# Patient Record
Sex: Female | Born: 1955 | Race: White | Hispanic: No | Marital: Married | Smoking: Never smoker
Health system: Southern US, Community
[De-identification: ages and names within clinical notes are randomized; demographics above are authoritative.]

---

## 2015-05-09 ENCOUNTER — Ambulatory Visit (INDEPENDENT_AMBULATORY_CARE_PROVIDER_SITE_OTHER): Payer: BLUE CROSS/BLUE SHIELD

## 2015-05-09 ENCOUNTER — Ambulatory Visit (INDEPENDENT_AMBULATORY_CARE_PROVIDER_SITE_OTHER): Payer: BLUE CROSS/BLUE SHIELD | Admitting: Podiatry

## 2015-05-09 ENCOUNTER — Encounter: Payer: Self-pay | Admitting: Podiatry

## 2015-05-09 VITALS — BP 150/94 | HR 82 | Resp 18

## 2015-05-09 DIAGNOSIS — R52 Pain, unspecified: Secondary | ICD-10-CM

## 2015-05-09 DIAGNOSIS — M722 Plantar fascial fibromatosis: Secondary | ICD-10-CM

## 2015-05-09 MED ORDER — DICLOFENAC SODIUM 75 MG PO TBEC: 75.0000 mg | DELAYED_RELEASE_TABLET | Freq: Two times a day (BID) | ORAL | Status: AC

## 2015-05-09 NOTE — Patient Instructions (Signed)

## 2015-05-09 NOTE — Progress Notes (Signed)
   Subjective:    Patient ID: Alexis Palmer, female    DOB: 01-12-1956, 60 y.o.   MRN: [id number]  HPI  60 year old female presents the office concerns of left heel pain which started in January after she was going barefoot at home quite a bit during the snow. She states that she has pain after rest which is relieved by ambulation. There is no numbness or tingling. No swelling or redness. No recent injury or trauma. She said no previous treatment other than ice to the area. Ice does seem to help. No other complaints at this time.  Review of Systems  All other systems reviewed and are negative.      Objective:   Physical Exam General: AAO x3, NAD  Dermatological: Skin is warm, dry and supple bilateral. Nails x 10 are well manicured; remaining integument appears unremarkable at this time. There are no open sores, no preulcerative lesions, no rash or signs of infection present.  Vascular: Dorsalis Pedis artery and Posterior Tibial artery pedal pulses are 2/4 bilateral with immedate capillary fill time. Pedal hair growth present. No varicosities and no lower extremity edema present bilateral. There is no pain with calf compression, swelling, warmth, erythema.   Neruologic: Grossly intact via light touch bilateral. Vibratory intact via tuning fork bilateral. Protective threshold with Semmes Wienstein monofilament intact to all pedal sites bilateral. Patellar and Achilles deep tendon reflexes 2+ bilateral. No Babinski or clonus noted bilateral.   Musculoskeletal:  Tenderness to palpation along the plantar medial tubercle of the calcaneus at the insertion of plantar fascia on the left foot. There is no pain along the course of the plantar fascia within the arch of the foot. Plantar fascia appears to be intact. There is no pain with lateral compression of the calcaneus or pain with vibratory sensation. There is no pain along the course or insertion of the achilles tendon. No other areas of tenderness  to bilateral lower extremities. ROM WNL. Muscular strength 5/5 in all groups tested bilateral.  Gait: Unassisted, Nonantalgic.      Assessment & Plan:  60 year old female left heel pain, likely plantar fasciitis -Treatment options discussed including all alternatives, risks, and complications -X-rays were obtained and reviewed with the patient.  -Etiology of symptoms were discussed -Patient elects to proceed with steroid injection into the left heel. Under sterile skin preparation, a total of 2.5cc of kenalog 10, 0.5% Marcaine plain, and 2% lidocaine plain were infiltrated into the symptomatic area without complication. A band-aid was applied. Patient tolerated the injection well without complication. Post-injection care with discussed with the patient. Discussed with the patient to ice the area over the next couple of days to help prevent a steroid flare.  -Fascial brace -Stretching, icing exercises -Ice to the area -Shoe gear modifications. Discussed orthotics. -Prescribed voltren. Discussed side effects of the medication and directed to stop if any are to occur and call the office.  -Follow-up in 3 weeks or sooner if any problems arise. In the meantime, encouraged to call the office with any questions, concerns, change in symptoms.   Ovid Curd, DPM

## 2015-05-30 ENCOUNTER — Encounter: Payer: Self-pay | Admitting: Podiatry

## 2015-05-30 ENCOUNTER — Ambulatory Visit (INDEPENDENT_AMBULATORY_CARE_PROVIDER_SITE_OTHER): Payer: BLUE CROSS/BLUE SHIELD | Admitting: Podiatry

## 2015-05-30 VITALS — BP 135/83 | HR 82 | Resp 18

## 2015-05-30 DIAGNOSIS — M722 Plantar fascial fibromatosis: Secondary | ICD-10-CM | POA: Diagnosis not present

## 2015-05-30 NOTE — Progress Notes (Signed)
Patient ID: Alexis Palmer, female   DOB: 1956-01-17, 60 y.o.   MRN: [id number]  Subjective: [name] presents to the office today for follow-up evaluation of left heel pain. She feels taht she is doing better, but still having some pain They have been icing, stretching, try to wear supportive shoe as much as possible. She has been wearing the plantar fascial brace as well.   No other complaints at this time. No acute changes since last appointment. They deny any systemic complaints such as fevers, chills, nausea, vomiting.   Objective: General: AAO x3, NAD  Dermatological: Skin is warm, dry and supple bilateral. Nails x 10 are well manicured; remaining integument appears unremarkable at this time. There are no open sores, no preulcerative lesions, no rash or signs of infection present.  Vascular: Dorsalis Pedis artery and Posterior Tibial artery pedal pulses are 2/4 bilateral with immedate capillary fill time. Pedal hair growth present. There is no pain with calf compression, swelling, warmth, erythema.   Neruologic: Grossly intact via light touch bilateral.   Musculoskeletal: There is decreased, but  continued tenderness palpation along the plantar medial tubercle of the calcaneus at the insertion of the plantar fascia on the left foot. There is no pain along the course of the plantar fascia within the arch of the foot. Plantar fascia appears to be intact bilaterally. There is no pain with lateral compression of the calcaneus and there is no pain with vibratory sensation. There is no pain along the course or insertion of the Achilles tendon. There are no other areas of tenderness to bilateral lower extremities. No gross boney pedal deformities bilateral. No pain, crepitus, or limitation noted with foot and ankle range of motion bilateral. Muscular strength 5/5 in all groups tested bilateral.  Gait: Unassisted, Nonantalgic.   Assessment: Presents for follow-up evaluation for heel pain,  likely plantar fasciitis with some improvement.   Plan: -Treatment options discussed including all alternatives, risks, and complications -Patient elects to proceed with steroid injection into the left  heel. Under sterile skin preparation, a total of 2.5cc of kenalog 10, 0.5% Marcaine plain, and 2% lidocaine plain were infiltrated into the symptomatic area without complication. A band-aid was applied. Patient tolerated the injection well without complication. Post-injection care with discussed with the patient. Discussed with the patient to ice the area over the next couple of days to help prevent a steroid flare.  -Continue plantar fascial brace -Voltaren prn -Ice and stretching exercises on a daily basis. -Continue supportive shoe gear. -Follow-up in 3 weeks or sooner if any problems arise. In the meantime, encouraged to call the office with any questions, concerns, change in symptoms.   Ovid Curd, DPM   *SHE DID NOT HAVE AN INJECTION LAST APPOINTMENT. CHARGES TO BE REMOVED.

## 2015-06-11 ENCOUNTER — Ambulatory Visit
Admission: EM | Admit: 2015-06-11 | Discharge: 2015-06-11 | Disposition: A | Discharge status: Home / Self Care | Payer: BLUE CROSS/BLUE SHIELD | Attending: Family Medicine | Admitting: Family Medicine

## 2015-06-11 DIAGNOSIS — J069 Acute upper respiratory infection, unspecified: Secondary | ICD-10-CM

## 2015-06-11 DIAGNOSIS — J309 Allergic rhinitis, unspecified: Secondary | ICD-10-CM | POA: Diagnosis not present

## 2015-06-11 DIAGNOSIS — R059 Cough, unspecified: Secondary | ICD-10-CM

## 2015-06-11 DIAGNOSIS — R05 Cough: Secondary | ICD-10-CM

## 2015-06-11 MED ORDER — HYDROCOD POLST-CPM POLST ER 10-8 MG/5ML PO SUER: 5.0000 mL | Freq: Every evening | ORAL | Status: AC | PRN

## 2015-06-11 MED ORDER — FLUTICASONE PROPIONATE 50 MCG/ACT NA SUSP: 2.0000 | Freq: Every day | NASAL | Status: AC

## 2015-06-11 NOTE — Discharge Instructions (Signed)
Tylenol or advil as directed as needed Cough, Adult Coughing is a reflex that clears your throat and your airways. Coughing helps to heal and protect your lungs. It is normal to cough occasionally, but a cough that happens with other symptoms or lasts a long time may be a sign of a condition that needs treatment. A cough may last only 2-3 weeks (acute), or it may last longer than 8 weeks (chronic). CAUSES Coughing is commonly caused by:  Breathing in substances that irritate your lungs.  A viral or bacterial respiratory infection.  Allergies.  Asthma.  Postnasal drip.  Smoking.  Acid backing up from the stomach into the esophagus (gastroesophageal reflux).  Certain medicines.  Chronic lung problems, including COPD (or rarely, lung cancer).  Other medical conditions such as heart failure. HOME CARE INSTRUCTIONS  Pay attention to any changes in your symptoms. Take these actions to help with your discomfort:  Take medicines only as told by your health care provider.  If you were prescribed an antibiotic medicine, take it as told by your health care provider. Do not stop taking the antibiotic even if you start to feel better.  Talk with your health care provider before you take a cough suppressant medicine.  Drink enough fluid to keep your urine clear or pale yellow.  If the air is dry, use a cold steam vaporizer or humidifier in your bedroom or your home to help loosen secretions.  Avoid anything that causes you to cough at work or at home.  If your cough is worse at night, try sleeping in a semi-upright position.  Avoid cigarette smoke. If you smoke, quit smoking. If you need help quitting, ask your health care provider.  Avoid caffeine.  Avoid alcohol.  Rest as needed. SEEK MEDICAL CARE IF:   You have new symptoms.  You cough up pus.  Your cough does not get better after 2-3 weeks, or your cough gets worse.  You cannot control your cough with suppressant  medicines and you are losing sleep.  You develop pain that is getting worse or pain that is not controlled with pain medicines.  You have a fever.  You have unexplained weight loss.  You have night sweats. SEEK IMMEDIATE MEDICAL CARE IF:  You cough up blood.  You have difficulty breathing.  Your heartbeat is very fast.   This information is not intended to replace advice given to you by your health care provider. Make sure you discuss any questions you have with your health care provider.   Document Released: 09/14/2010 Document Revised: 12/07/2014 Document Reviewed: 05/25/2014 Elsevier Interactive Patient Education [employer]2016 [employer] Inc[employer]

## 2015-06-11 NOTE — ED Notes (Signed)
Patient c/o cough and chest congestion which started a few days ago.  Denies fever/c/n/c or chest pain.

## 2015-06-11 NOTE — ED Provider Notes (Signed)
CSN: [id number]     Arrival date & time 06/11/15  1145 History   First MD Initiated Contact with Patient 06/11/15 1400     Chief Complaint  Patient presents with  . Cough    HPI  Alexis Palmer is a pleasant 60 y.o. female who presents with acute cough and chest congestion and nasal congestion for last 4 days. She also has a headache. Her cough has been nonproductive. She denies any history of asthma but does have seasonal allergies. She denies any contacts. She has tried over-the-counter Mucinex for the past 4 days. She denies chest pain, shortness of breath, or wheezing. She does have postnasal drip. Her cough is worse at bedtime while she is trying to sleep. She denies any fever. History reviewed. No pertinent past medical history. History reviewed. No pertinent past surgical history. History reviewed. No pertinent family history. Social History  Substance Use Topics  . Smoking status: Never Smoker   . Smokeless tobacco: Never Used  . Alcohol Use: No   OB History    No data available     Review of Systems  Constitutional: Negative.   HENT: Positive for congestion, postnasal drip and rhinorrhea. Negative for sinus pressure.   Eyes: Negative.   Respiratory: Positive for cough and shortness of breath. Negative for chest tightness and wheezing.   Cardiovascular: Negative.   Gastrointestinal: Negative.   Endocrine: Negative.   Genitourinary: Negative.   Musculoskeletal: Negative.   Skin: Negative.   Neurological: Negative.   Hematological: Negative.   Psychiatric/Behavioral: Negative.     Allergies  Penicillin g and Sulfamethoxazole-trimethoprim  Home Medications   Prior to Admission medications   Medication Sig Start Date End Date Taking? Authorizing Provider  Multiple Vitamin (MULTIVITAMIN) capsule Take 1 capsule by mouth daily.   Yes Historical Provider, MD  Black Cohosh ([facility] BLACK COHOSH) 540 MG CAPS Take by mouth.    Historical Provider, MD   chlorpheniramine-HYDROcodone (TUSSIONEX PENNKINETIC ER) 10-8 MG/5ML SUER Take 5 mLs by mouth at bedtime as needed for cough. 06/11/15   Joselyn ArrowKandice L Alixander Rallis, NP  Cholecalciferol (VITAMIN D3) 1000 units CAPS Take by mouth.    Historical Provider, MD  Coenzyme Q10 Liposomal 100 MG/ML LIQD Take by mouth.    Historical Provider, MD  diclofenac (VOLTAREN) 75 MG EC tablet Take 1 tablet (75 mg total) by mouth 2 (two) times daily. 05/09/15   Vivi BarrackMatthew R Wagoner, DPM  fluticasone (FLONASE) 50 MCG/ACT nasal spray Place 2 sprays into both nostrils daily. 06/11/15   Joselyn ArrowKandice L Shailyn Weyandt, NP  simvastatin (ZOCOR) 20 MG tablet  03/09/15   Historical Provider, MD   Meds Ordered and Administered this Visit  Medications - No data to display  BP 139/71 mmHg  Pulse 67  Temp(Src) 98.2 F (36.8 C) (Oral)  Resp 16  Ht 5\' 4"  (1.626 m)  Wt 159 lb (72.122 kg)  BMI 27.28 kg/m2  SpO2 99% No data found.   Physical Exam  Constitutional: She is oriented to person, place, and time. She appears well-developed and well-nourished. No distress.  HENT:  Head: Normocephalic and atraumatic.  Right Ear: Hearing, tympanic membrane, external ear and ear canal normal.  Left Ear: Hearing, tympanic membrane, external ear and ear canal normal.  Nose: Mucosal edema and rhinorrhea present. Right sinus exhibits no maxillary sinus tenderness and no frontal sinus tenderness. Left sinus exhibits no maxillary sinus tenderness and no frontal sinus tenderness.  Mouth/Throat: Uvula is midline, oropharynx is clear and moist and mucous membranes are normal.  Eyes: Conjunctivae are normal. No scleral icterus.  Neck: Normal range of motion.  Cardiovascular: Normal rate and regular rhythm.   Pulmonary/Chest: Effort normal and breath sounds normal. No tachypnea. No respiratory distress.  Abdominal: Soft. Bowel sounds are normal. She exhibits no distension.  Musculoskeletal: Normal range of motion. She exhibits no edema or tenderness.  Neurological: She is  alert and oriented to person, place, and time. No cranial nerve deficit.  Skin: Skin is warm and dry. No rash noted. No erythema.  Psychiatric: Her behavior is normal. Judgment normal.  Nursing note and vitals reviewed.   ED Course  Procedures NA  Labs Review Labs Reviewed - No data to display  Imaging Review No results found.  MDM   1. Acute URI   2. Cough   3. Allergic rhinitis, unspecified allergic rhinitis type    Explained to patient this is likely viral URI with underlying allergic rhinitis. Cough and symptoms may persist for up to 3 weeks at times. Continue supportive measures. Reassured and discussed warning signs for patient to return to clinic including SOB, weakness, unrelenting fever, severe pain or worsening symptoms.  Plan: Diagnosis reviewed with patient Ibuprofen 400-600mg  TID PRN Or tylenol PRN as directed Continue Mucinex 1-2 tabs BID PRN Recommend supportive treatment with rest,  Increased fluids Seek additional medical care if symptoms worsen or are not improving    Joselyn Arrow, NP 06/11/15 1432

## 2015-06-20 ENCOUNTER — Ambulatory Visit (INDEPENDENT_AMBULATORY_CARE_PROVIDER_SITE_OTHER): Payer: BLUE CROSS/BLUE SHIELD | Admitting: Podiatry

## 2015-06-20 ENCOUNTER — Encounter: Payer: Self-pay | Admitting: Podiatry

## 2015-06-20 VITALS — BP 133/89 | HR 89 | Resp 18

## 2015-06-20 DIAGNOSIS — M722 Plantar fascial fibromatosis: Secondary | ICD-10-CM

## 2015-06-20 NOTE — Patient Instructions (Signed)
You can purchase powersteps or superfeet inserts at some of the sporting good stores. If you cannot find a pair that you like, you can stop in to our front desk and purchase a pair.

## 2015-06-21 NOTE — Progress Notes (Signed)
Patient ID: Alexis DevoidDoreen Savard, female   DOB: 11/12/1955, 60 y.o.   MRN: [id number]  Subjective: Alexis Palmer presents to the office today for follow-up evaluation of left heel pain. She states that she is doing well, and not having any pain at this time. She has continued with icing, stretching, plantar fascial brace, and supportive shoes. She is able to do activities without any pain. No morning pain. She is not taking NSAIDs.  No other complaints at this time. No acute changes since last appointment. They deny any systemic complaints such as fevers, chills, nausea, vomiting.  Objective: General: AAO x3, NAD  Dermatological: Skin is warm, dry and supple bilateral. Nails x 10 are well manicured; remaining integument appears unremarkable at this time. There are no open sores, no preulcerative lesions, no rash or signs of infection present.  Vascular: Dorsalis Pedis artery and Posterior Tibial artery pedal pulses are 2/4 bilateral with immedate capillary fill time. Pedal hair growth present. There is no pain with calf compression, swelling, warmth, erythema.   Neruologic: Grossly intact via light touch bilateral. Vibratory intact via tuning fork bilateral. Protective threshold with Semmes Wienstein monofilament intact to all pedal sites bilateral.   Musculoskeletal: There is currently no tenderness palpation along the plantar medial tubercle of the calcaneus at the insertion of the plantar fascia on the left foot. There is no pain along the course of the plantar fascia within the arch of the foot. Plantar fascia appears to be intact bilaterally. There is no pain with lateral compression of the calcaneus and there is no pain with vibratory sensation. There is no pain along the course or insertion of the Achilles tendon. There are no other areas of tenderness to bilateral lower extremities. No gross boney pedal deformities bilateral. No pain, crepitus, or limitation noted with foot and ankle range of motion  bilateral. Muscular strength 5/5 in all groups tested bilateral.  Gait: Unassisted, Nonantalgic.   Assessment: Presents for follow-up evaluation for heel pain, likely plantar fasciitis  With resolved pain  Plan: -Treatment options discussed including all alternatives, risks, and complications -At this time she has no pain. Continue to rehab the plantar fascia and work on stretching exercises to help minimize reoccurance. Continue stretching and supportive shoegear. Discussed orthotics. She ill look at OTC at this time. She may come back in to get powersteps.  -Follow-up as needed. In the meantime, encouraged to call the office with any questions, concerns, change in symptoms.   Ovid CurdMatthew Ja Ohman, DPM

## 2015-08-30 ENCOUNTER — Other Ambulatory Visit: Payer: Self-pay | Admitting: Obstetrics & Gynecology

## 2015-08-30 DIAGNOSIS — Z1231 Encounter for screening mammogram for malignant neoplasm of breast: Secondary | ICD-10-CM

## 2015-09-18 ENCOUNTER — Other Ambulatory Visit: Payer: Self-pay | Admitting: Obstetrics & Gynecology

## 2015-09-18 ENCOUNTER — Ambulatory Visit
Admission: RE | Admit: 2015-09-18 | Discharge: 2015-09-18 | Disposition: A | Discharge status: Home / Self Care | Payer: BLUE CROSS/BLUE SHIELD | Source: Ambulatory Visit | Attending: Obstetrics & Gynecology | Admitting: Obstetrics & Gynecology

## 2015-09-18 DIAGNOSIS — Z1231 Encounter for screening mammogram for malignant neoplasm of breast: Secondary | ICD-10-CM | POA: Diagnosis not present

## 2018-09-24 ENCOUNTER — Ambulatory Visit
Admission: RE | Admit: 2018-09-24 | Discharge: 2018-09-24 | Disposition: A | Discharge status: Home / Self Care | Payer: BC Managed Care – PPO | Attending: Family Medicine | Admitting: Family Medicine

## 2018-09-24 ENCOUNTER — Ambulatory Visit
Admission: RE | Admit: 2018-09-24 | Discharge: 2018-09-24 | Disposition: A | Discharge status: Home / Self Care | Payer: BC Managed Care – PPO | Source: Ambulatory Visit | Attending: Family Medicine | Admitting: Family Medicine

## 2018-09-24 ENCOUNTER — Other Ambulatory Visit: Payer: Self-pay | Admitting: Family Medicine

## 2018-09-24 ENCOUNTER — Other Ambulatory Visit: Payer: Self-pay

## 2018-09-24 DIAGNOSIS — R1030 Lower abdominal pain, unspecified: Secondary | ICD-10-CM | POA: Insufficient documentation

## 2021-01-17 IMAGING — CR ABDOMEN - 2 VIEW
4 series · 4 of 4 positions shown · non-contrast
Comparison: None.

CLINICAL DATA: Severe constipation and bloating since [REDACTED].

EXAM:
ABDOMEN - 2 VIEW

[abdomen erect (1 of 2)]
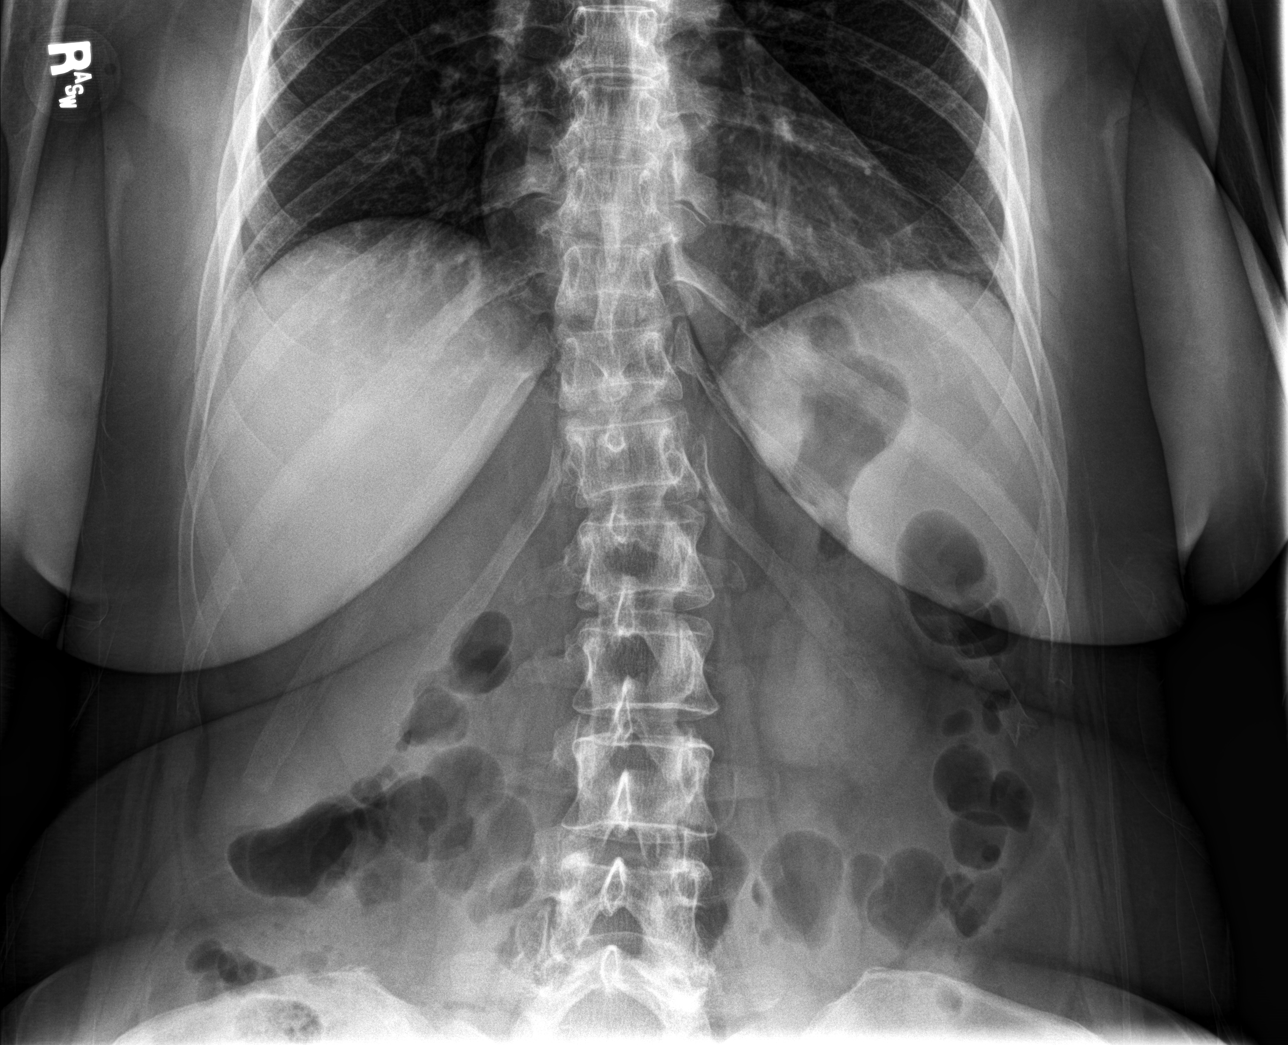

[abdomen erect (2 of 2)]
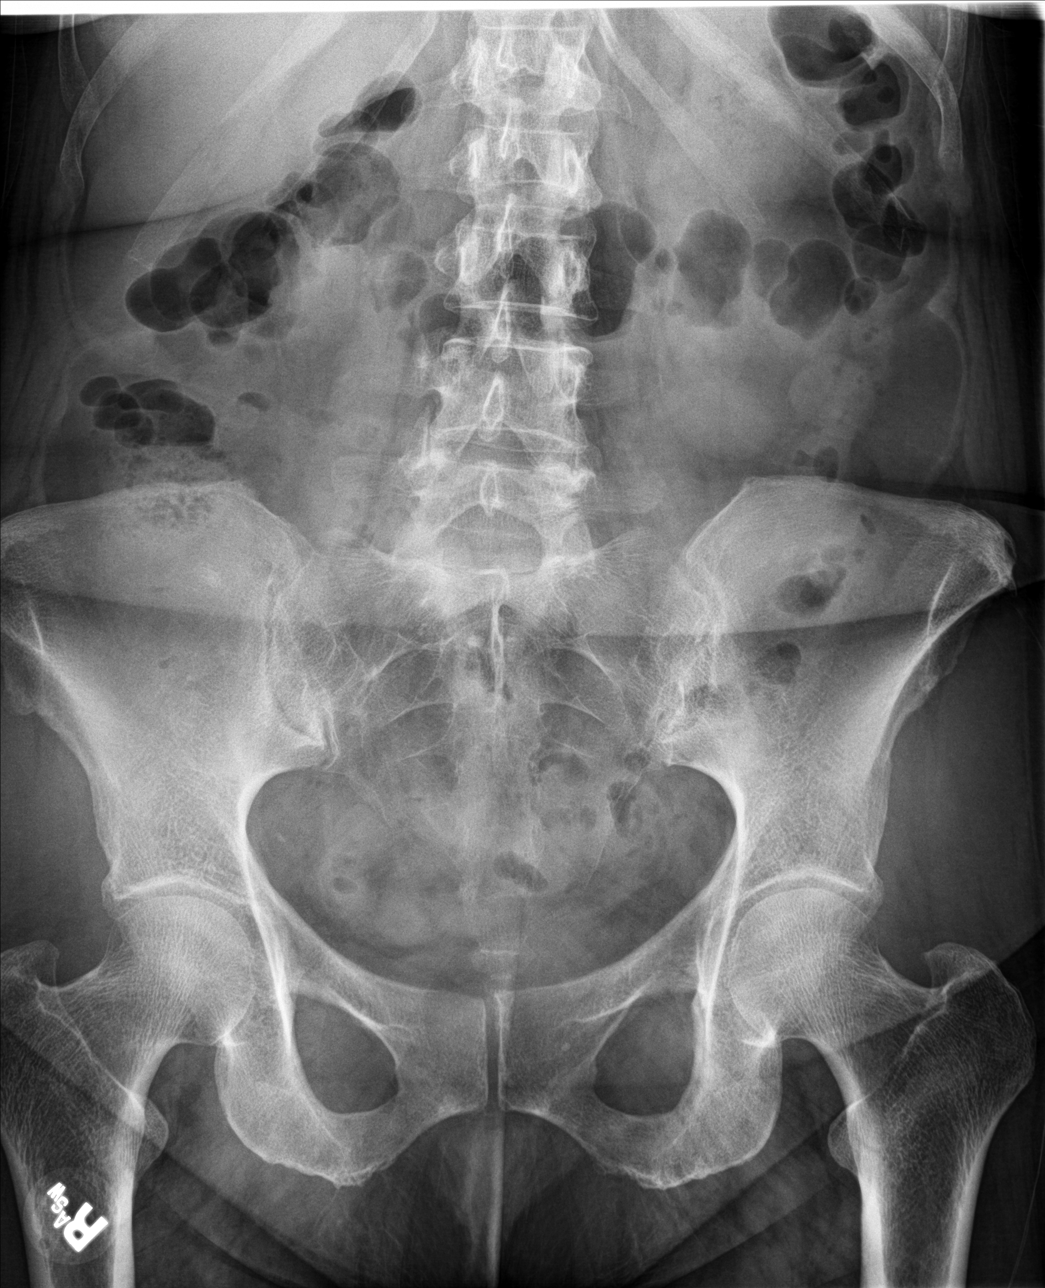

[abdomen supine (1 of 2)]
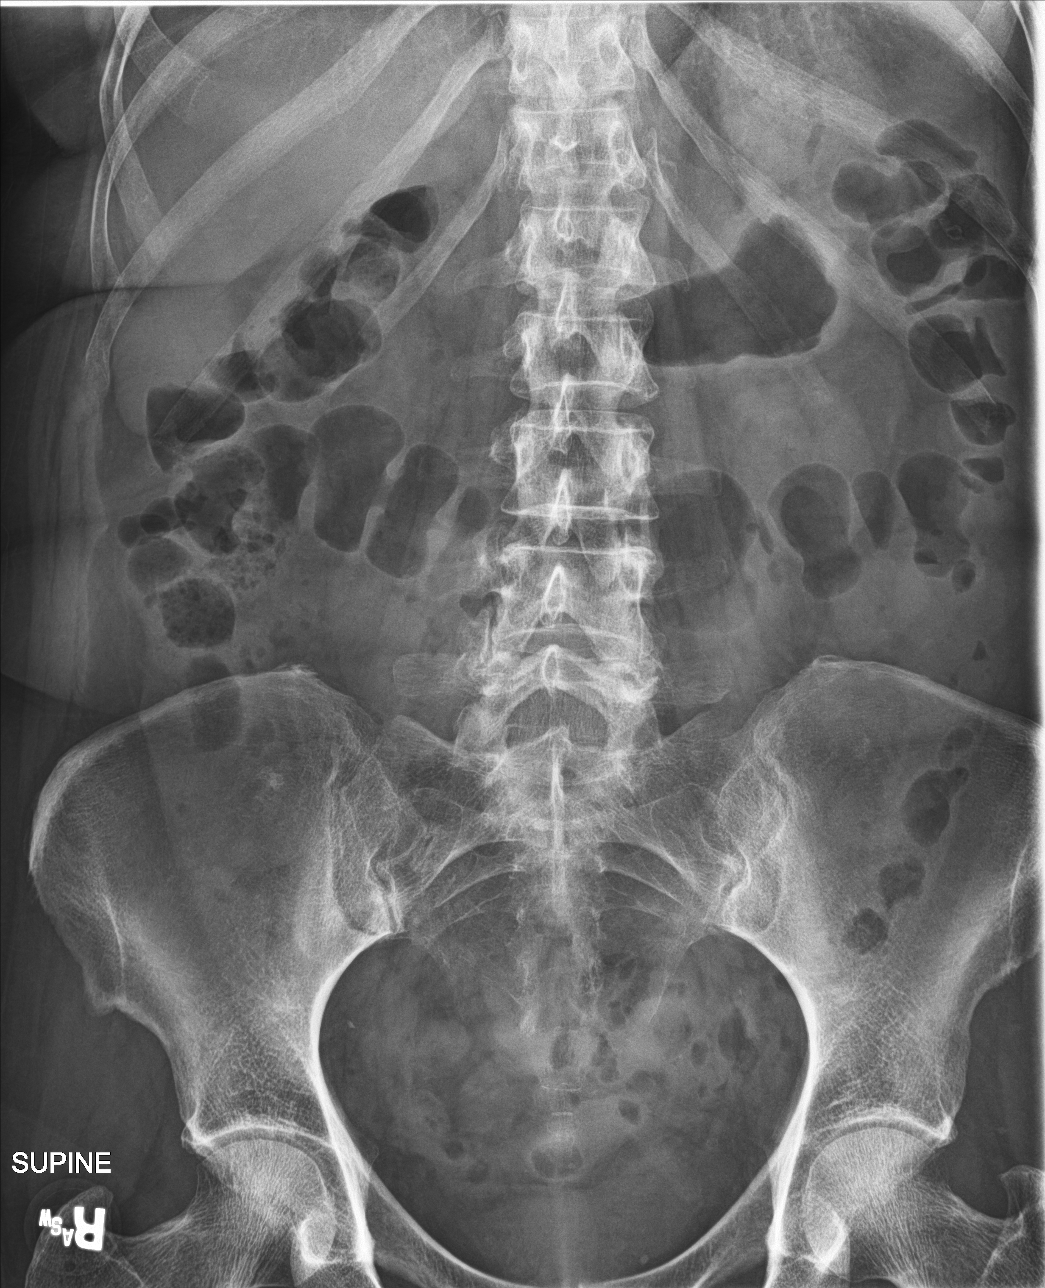

[abdomen supine (2 of 2)]
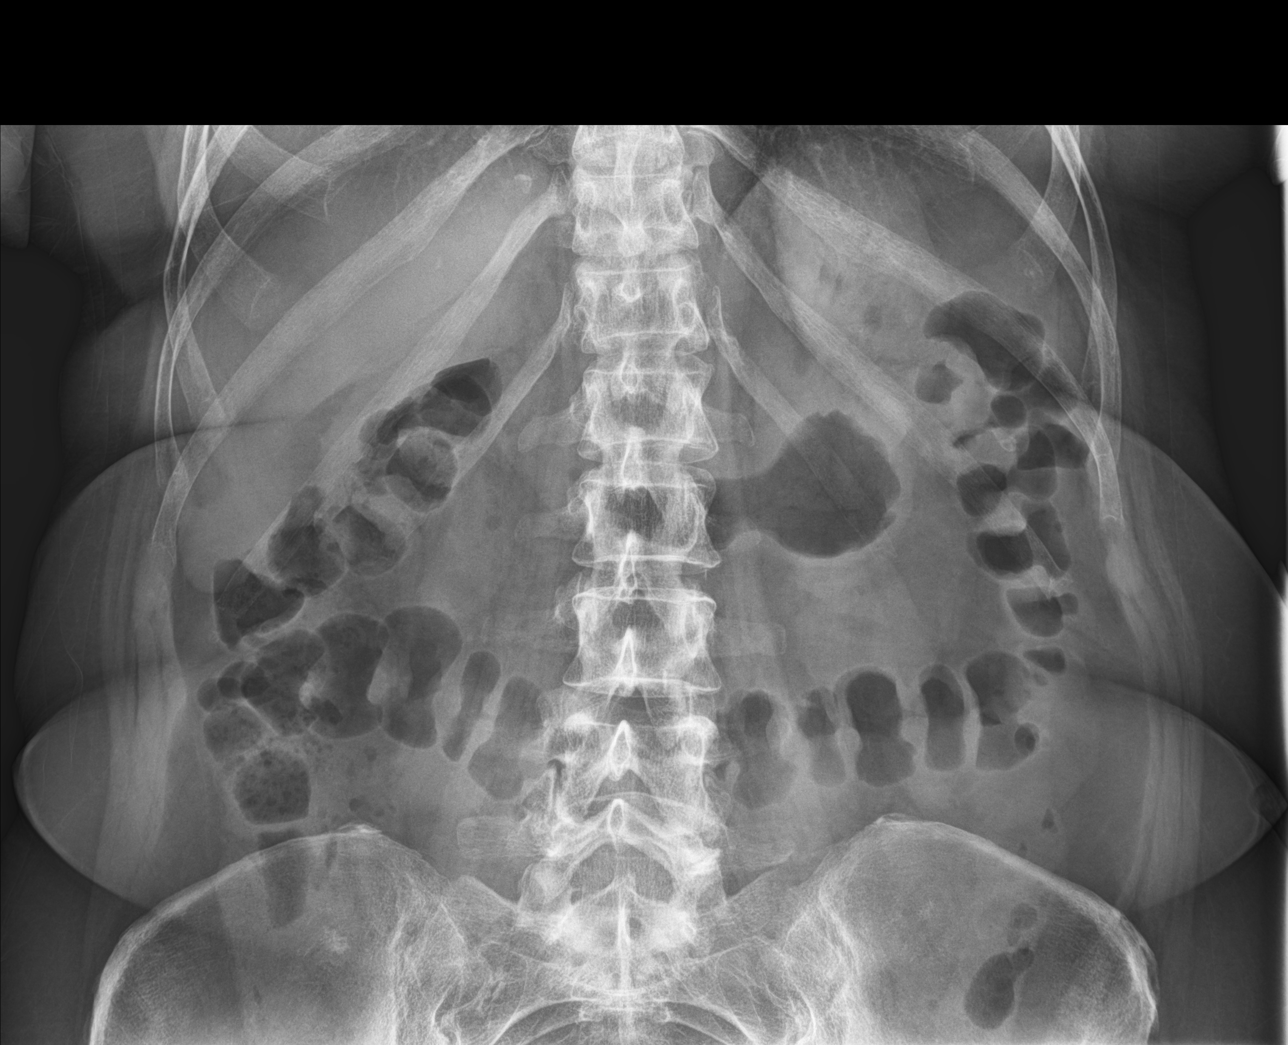

[4 of 4 positions shown; findings below may reference images not displayed]

FINDINGS: The bowel gas pattern is normal. There is no evidence of free air.
No radio-opaque calculi or other significant radiographic
abnormality is seen.
IMPRESSION: Negative.
